# Patient Record
Sex: Male | Born: 1995 | Race: White | Hispanic: No | Marital: Single | Smoking: Current some day smoker
Health system: Southern US, Community
[De-identification: ages and names within clinical notes are randomized; demographics above are authoritative.]

---

## 2017-05-08 ENCOUNTER — Emergency Department
Admission: EM | Admit: 2017-05-08 | Discharge: 2017-05-08 | Disposition: A | Discharge status: Home / Self Care | Payer: 59 | Attending: Emergency Medicine | Admitting: Emergency Medicine

## 2017-05-08 ENCOUNTER — Emergency Department: Payer: 59

## 2017-05-08 ENCOUNTER — Encounter: Payer: Self-pay | Admitting: Emergency Medicine

## 2017-05-08 ENCOUNTER — Other Ambulatory Visit: Payer: Self-pay

## 2017-05-08 DIAGNOSIS — N201 Calculus of ureter: Secondary | ICD-10-CM

## 2017-05-08 DIAGNOSIS — F17210 Nicotine dependence, cigarettes, uncomplicated: Secondary | ICD-10-CM | POA: Diagnosis not present

## 2017-05-08 DIAGNOSIS — R1031 Right lower quadrant pain: Secondary | ICD-10-CM | POA: Diagnosis present

## 2017-05-08 LAB — CBC
HCT: 44.5 % (ref 40.0–52.0)
HEMOGLOBIN: 15.3 g/dL (ref 13.0–18.0)
MCH: 32.2 pg (ref 26.0–34.0)
MCHC: 34.3 g/dL (ref 32.0–36.0)
MCV: 93.7 fL (ref 80.0–100.0)
PLATELETS: 232 10*3/uL (ref 150–440)
RBC: 4.75 MIL/uL (ref 4.40–5.90)
RDW: 13.1 % (ref 11.5–14.5)
WBC: 10.9 10*3/uL — ABNORMAL HIGH (ref 3.8–10.6)

## 2017-05-08 LAB — COMPREHENSIVE METABOLIC PANEL
ALK PHOS: 43 U/L (ref 38–126)
ALT: 11 U/L — ABNORMAL LOW (ref 17–63)
ANION GAP: 12 (ref 5–15)
AST: 25 U/L (ref 15–41)
Albumin: 4.7 g/dL (ref 3.5–5.0)
BUN: 15 mg/dL (ref 6–20)
CALCIUM: 9.2 mg/dL (ref 8.9–10.3)
CHLORIDE: 103 mmol/L (ref 101–111)
CO2: 24 mmol/L (ref 22–32)
CREATININE: 1.27 mg/dL — AB (ref 0.61–1.24)
Glucose, Bld: 123 mg/dL — ABNORMAL HIGH (ref 65–99)
Potassium: 3.4 mmol/L — ABNORMAL LOW (ref 3.5–5.1)
SODIUM: 139 mmol/L (ref 135–145)
Total Bilirubin: 0.8 mg/dL (ref 0.3–1.2)
Total Protein: 7.8 g/dL (ref 6.5–8.1)

## 2017-05-08 LAB — URINALYSIS, COMPLETE (UACMP) WITH MICROSCOPIC
Bacteria, UA: NONE SEEN
Bilirubin Urine: NEGATIVE
GLUCOSE, UA: NEGATIVE mg/dL
KETONES UR: 5 mg/dL — AB
LEUKOCYTES UA: NEGATIVE
Nitrite: NEGATIVE
PH: 6 (ref 5.0–8.0)
PROTEIN: 30 mg/dL — AB
Specific Gravity, Urine: 1.02 (ref 1.005–1.030)

## 2017-05-08 LAB — LIPASE, BLOOD: LIPASE: 17 U/L (ref 11–51)

## 2017-05-08 MED ORDER — ONDANSETRON HCL 4 MG/2ML IJ SOLN
4.0000 mg | Freq: Once | INTRAMUSCULAR | Status: AC
  Administered 2017-05-08: 4 mg via INTRAVENOUS
  Filled 2017-05-08: qty 2

## 2017-05-08 MED ORDER — IBUPROFEN 600 MG PO TABS: 600.0000 mg | ORAL_TABLET | Freq: Four times a day (QID) | ORAL | 0 refills | Status: DC | PRN

## 2017-05-08 MED ORDER — IOPAMIDOL (ISOVUE-300) INJECTION 61%
75.0000 mL | Freq: Once | INTRAVENOUS | Status: AC | PRN
  Administered 2017-05-08: 75 mL via INTRAVENOUS

## 2017-05-08 MED ORDER — MORPHINE SULFATE (PF) 4 MG/ML IV SOLN
4.0000 mg | Freq: Once | INTRAVENOUS | Status: AC
  Administered 2017-05-08: 4 mg via INTRAVENOUS
  Filled 2017-05-08: qty 1

## 2017-05-08 MED ORDER — HYDROCODONE-ACETAMINOPHEN 5-325 MG PO TABS: 1.0000 | ORAL_TABLET | Freq: Four times a day (QID) | ORAL | 0 refills | Status: AC | PRN

## 2017-05-08 MED ORDER — IOPAMIDOL (ISOVUE-300) INJECTION 61%
30.0000 mL | Freq: Once | INTRAVENOUS | Status: AC | PRN
  Administered 2017-05-08: 30 mL via ORAL

## 2017-05-08 MED ORDER — IBUPROFEN 600 MG PO TABS: 600.0000 mg | ORAL_TABLET | Freq: Four times a day (QID) | ORAL | 0 refills | Status: AC | PRN

## 2017-05-08 MED ORDER — HYDROCODONE-ACETAMINOPHEN 5-325 MG PO TABS: 1.0000 | ORAL_TABLET | Freq: Four times a day (QID) | ORAL | 0 refills | Status: DC | PRN

## 2017-05-08 MED ORDER — SODIUM CHLORIDE 0.9 % IV BOLUS (SEPSIS)
1000.0000 mL | Freq: Once | INTRAVENOUS | Status: AC
  Administered 2017-05-08: 1000 mL via INTRAVENOUS

## 2017-05-08 NOTE — ED Notes (Signed)
Patient ambulatory to lobby with steady gait and NAD noted. Verbalized understanding of discharge instructions and follow-up care.  

## 2017-05-08 NOTE — Discharge Instructions (Signed)
Return to the ER for new, worsening, or persistent severe pain, vomiting, fevers, weakness, or any other new or worsening symptoms that concern you.  You can take the ibuprofen for pain, and the Vicodin for hydrocodone for more severe pain not controlled by the ibuprofen.  If your symptoms resolve you can follow-[facility] at the [facility], but a urology referral has been provided for you as well.

## 2017-05-08 NOTE — ED Triage Notes (Signed)
C/O RLQ abdominal pain, onset this morning at 0530.  Symptoms include N/V.  Able to tolerate water PO.

## 2017-05-08 NOTE — ED Provider Notes (Signed)
[facility] Emergency Department Provider Note ____________________________________________   First MD Initiated Contact with Patient 05/08/17 1222     (approximate)  I have reviewed the triage vital signs and the nursing notes.   HISTORY  Chief Complaint Abdominal Pain    HPI Noah Gonzales is a 22 y.o. male with no significant past medical history who presents with right lower quadrant pain, acute onset approximately 6 hours ago, persistent course, nonradiating, and associated with nausea and one episode of vomiting.  Patient denies diarrhea or associated fever or chills.  Last bowel movement was yesterday and was normal.  No prior history of this pain.   History reviewed. No pertinent past medical history.  There are no active problems to display for this patient.   History reviewed. No pertinent surgical history.  Prior to Admission medications   Medication Sig Start Date End Date Taking? Authorizing Provider  HYDROcodone-acetaminophen (NORCO/VICODIN) 5-325 MG tablet Take 1 tablet by mouth every 6 (six) hours as needed for up to 5 days for severe pain. 05/08/17 05/13/17  Dionne Bucy, MD  ibuprofen (ADVIL,MOTRIN) 600 MG tablet Take 1 tablet (600 mg total) by mouth every 6 (six) hours as needed. 05/08/17   Dionne Bucy, MD    Allergies Patient has no known allergies.  No family history on file.  Social History Social History   Tobacco Use  . Smoking status: Current Some Day Smoker    Types: Cigarettes  . Smokeless tobacco: Never Used  Substance Use Topics  . Alcohol use: Not on file  . Drug use: Not on file    Review of Systems  Constitutional: No fever. Eyes: No redness. ENT: No sore throat. Cardiovascular: Denies chest pain. Respiratory: Denies shortness of breath. Gastrointestinal: Positive for nausea and one episode of vomiting.  Genitourinary: Negative for dysuria.  Musculoskeletal: Negative for back  pain. Skin: Negative for rash. Neurological: Negative for headache.   ____________________________________________   PHYSICAL EXAM:  VITAL SIGNS: ED Triage Vitals  Enc Vitals Group     BP 05/08/17 1009 116/82     Pulse Rate 05/08/17 1009 (!) 57     Resp 05/08/17 1009 18     Temp 05/08/17 1009 97.7 F (36.5 C)     Temp Source 05/08/17 1009 Oral     SpO2 05/08/17 1009 100 %     Weight 05/08/17 1009 140 lb (63.5 kg)     Height 05/08/17 1009 5\' 8"  (1.727 m)     Head Circumference --      Peak Flow --      Pain Score 05/08/17 1023 6     Pain Loc --      Pain Edu? --      Excl. in GC? --     Constitutional: Alert and oriented. Well appearing and in no acute distress. Eyes: Conjunctivae are normal.  No scleral icterus. Head: Atraumatic. Nose: No congestion/rhinnorhea. Mouth/Throat: Mucous membranes are moist.   Neck: Normal range of motion.  Cardiovascular: Good peripheral circulation. Respiratory: Normal respiratory effort.   Gastrointestinal: Soft with moderate right lower quadrant tenderness. No distention.  Genitourinary: No flank tenderness. Musculoskeletal: Extremities warm and well perfused.  Neurologic:  Normal speech and language. No gross focal neurologic deficits are appreciated.  Skin:  Skin is warm and dry. No rash noted. Psychiatric: Mood and affect are normal. Speech and behavior are normal.  ____________________________________________   LABS (all labs ordered are listed, but only abnormal results are displayed)  Labs Reviewed  COMPREHENSIVE METABOLIC PANEL - Abnormal; Notable for the following components:      Result Value   Potassium 3.4 (*)    Glucose, Bld 123 (*)    Creatinine, Ser 1.27 (*)    ALT 11 (*)    All other components within normal limits  CBC - Abnormal; Notable for the following components:   WBC 10.9 (*)    All other components within normal limits  URINALYSIS, COMPLETE (UACMP) WITH MICROSCOPIC - Abnormal; Notable for the  following components:   Color, Urine AMBER (*)    APPearance HAZY (*)    Hgb urine dipstick LARGE (*)    Ketones, ur 5 (*)    Protein, ur 30 (*)    Squamous Epithelial / LPF 0-5 (*)    All other components within normal limits  LIPASE, BLOOD   ____________________________________________  EKG   ____________________________________________  RADIOLOGY  CT abd: 2 mm stone right UVJ, no evidence of acute appendicitis  ____________________________________________   PROCEDURES  Procedure(s) performed: No  Procedures  Critical Care performed: No ____________________________________________   INITIAL IMPRESSION / ASSESSMENT AND PLAN / ED COURSE  Pertinent labs & imaging results that were available during my care of the patient were reviewed by me and considered in my medical decision making (see chart for details).  22 year old male with no significant past medical history presents with relatively acute onset of right lower quadrant pain associated with nausea.  Past medical records reviewed in epic and are noncontributory.  On exam, the patient is well-appearing, vital signs are normal, but there is relatively significant tenderness in the right lower quadrant.  Presentation is most concerning for acute appendicitis; differential also includes mesenteric adenitis or other benign cause.  Plan for labs, UA, and CT abdomen.  ----------------------------------------- 2:16 PM on 05/08/2017 -----------------------------------------  CT shows 2 mm right UVJ stone with no evidence of acute appendicitis.  Patient's pain is well controlled, UA is not consistent with infection, and the lab workup is unremarkable.  The patient feels well to go home.  No indication for emergent urological consultation.  We will discharge home with analgesia and urology referral.  Workup and plan explained to the patient, and return precautions given; he expresses  understanding. ____________________________________________   FINAL CLINICAL IMPRESSION(S) / ED DIAGNOSES  Final diagnoses:  Ureteral stone      NEW MEDICATIONS STARTED DURING THIS VISIT:  New Prescriptions   HYDROCODONE-ACETAMINOPHEN (NORCO/VICODIN) 5-325 MG TABLET    Take 1 tablet by mouth every 6 (six) hours as needed for up to 5 days for severe pain.   IBUPROFEN (ADVIL,MOTRIN) 600 MG TABLET    Take 1 tablet (600 mg total) by mouth every 6 (six) hours as needed.     Note:  This document was prepared using Dragon voice recognition software and may include unintentional dictation errors.    Dionne BucySiadecki, Noah Konczal, MD 05/08/17 1419

## 2017-05-08 NOTE — ED Notes (Signed)
Updated friend of pt that he was next to get back to see the Dr.

## 2019-01-11 IMAGING — CT CT ABD-PELV W/ CM
2 of 4 series · 15 of 46 positions shown, 17 images · IV contrast (APPLIED)
Comparison: None.

CLINICAL DATA: Right lower quadrant pain.  Nausea and vomiting.

EXAM:
CT ABDOMEN AND PELVIS WITH CONTRAST
TECHNIQUE: Multidetector CT imaging of the abdomen and pelvis was performed
using the standard protocol following bolus administration of
intravenous contrast.
CONTRAST:  75mL [id number] IOPAMIDOL ([id number]) INJECTION 61%

[Series 2: routine abd/pel with · axial · 0.64mm/px · z∈[-491,-51]mm · 12 of 100 slices shown, 14 images]
[im 8/100  soft-tissue]
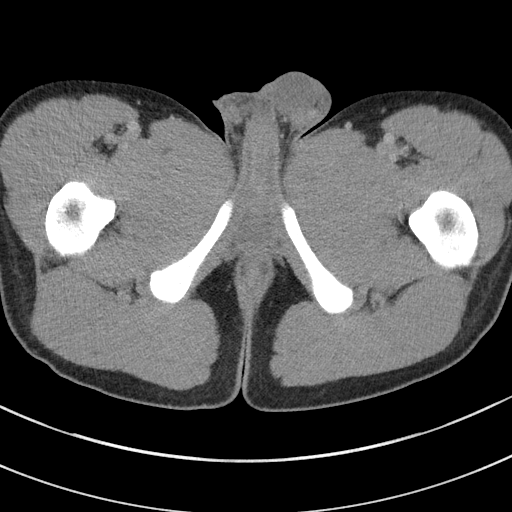
[im 8/100  bone]
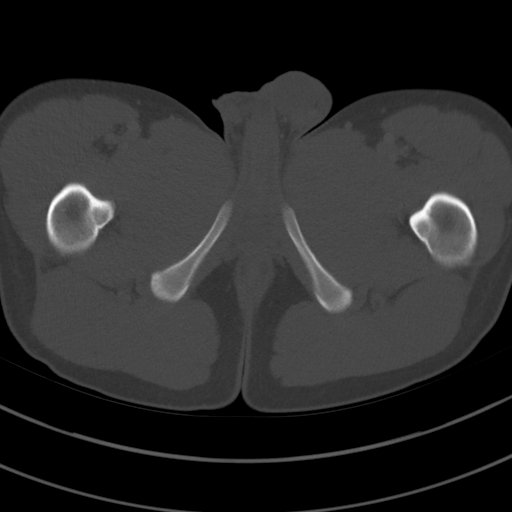
[im 16/100  soft-tissue]
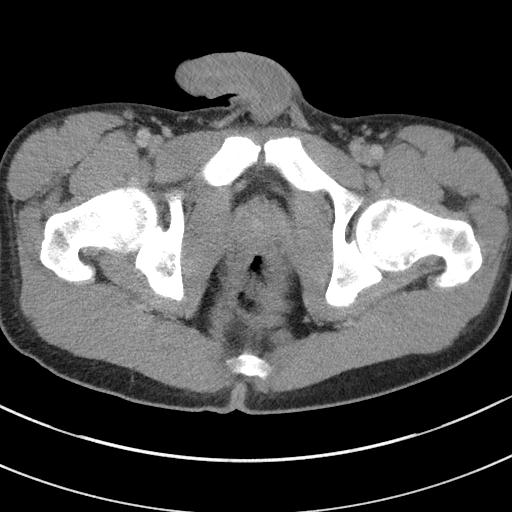
[im 24/100  soft-tissue]
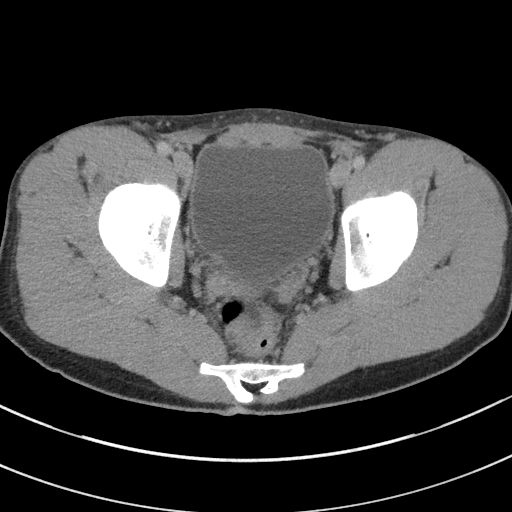
[im 32/100  soft-tissue]
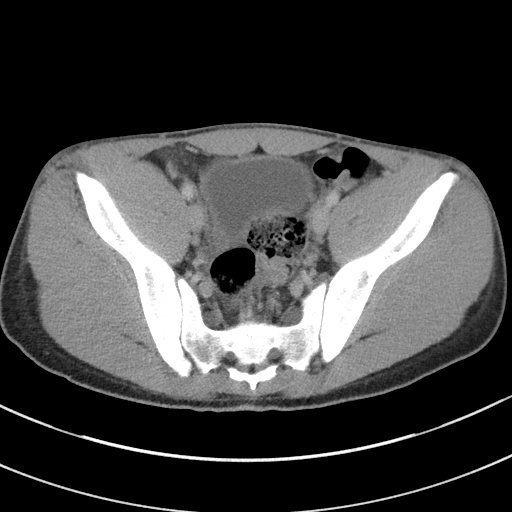
[im 40/100  soft-tissue]
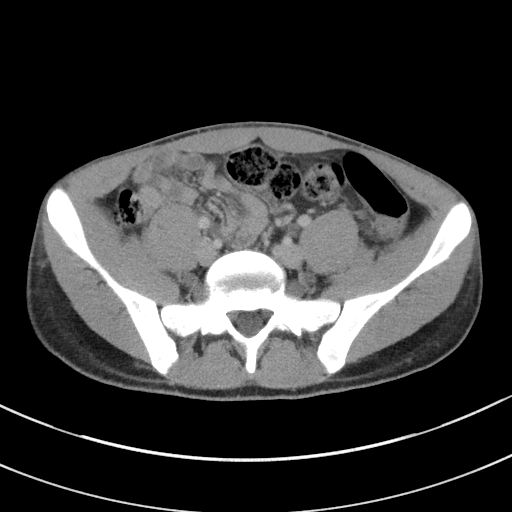
[im 48/100  soft-tissue]
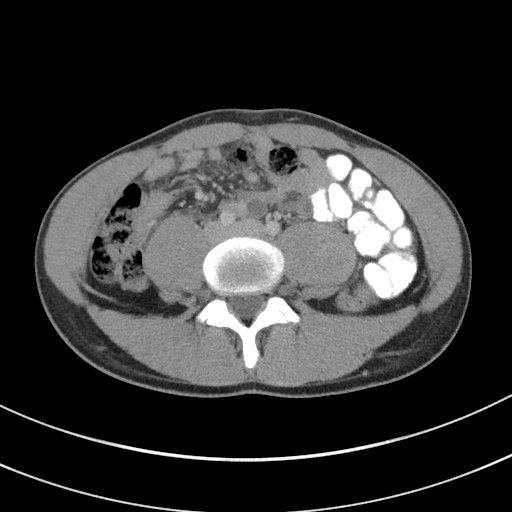
[im 56/100  soft-tissue]
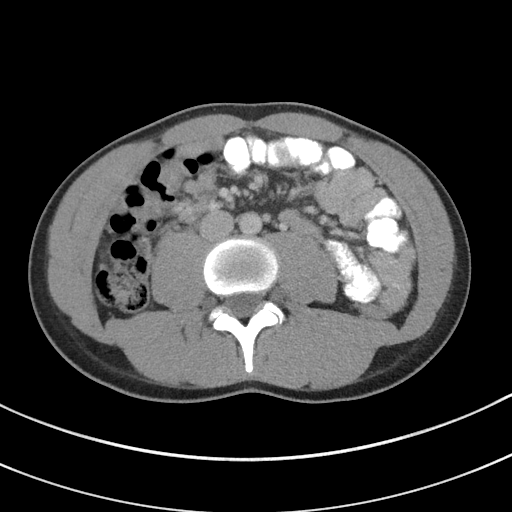
[im 64/100  soft-tissue]
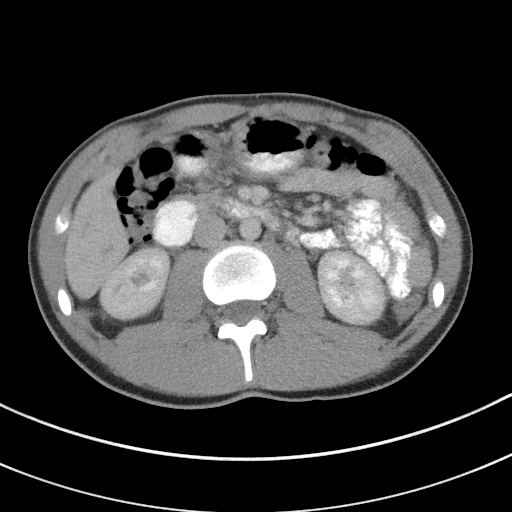
[im 72/100  soft-tissue]
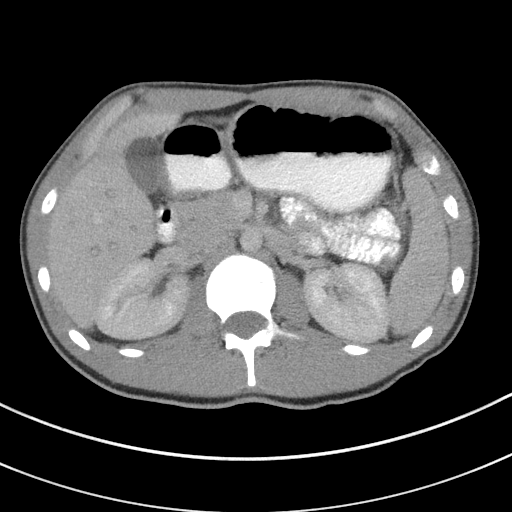
[im 72/100  bone]
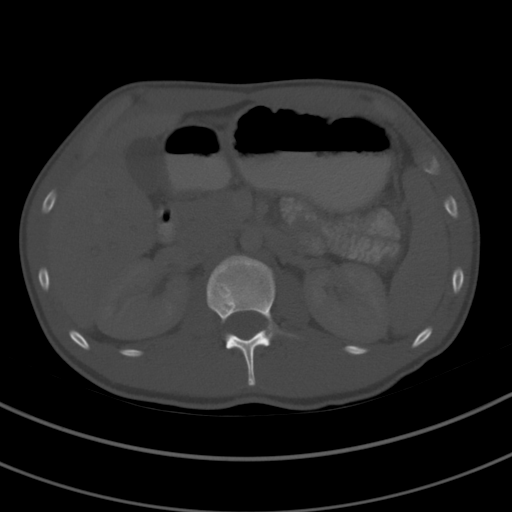
[im 80/100  soft-tissue]
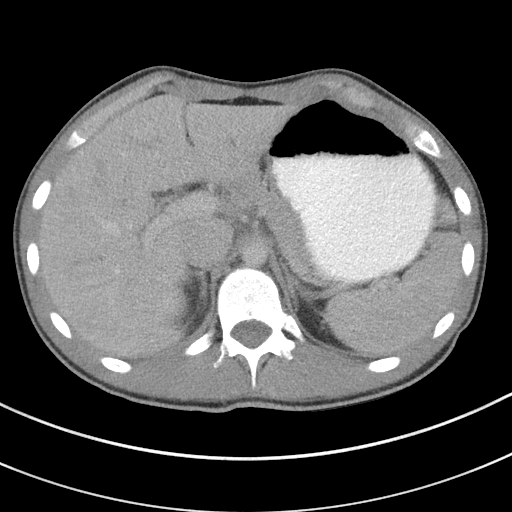
[im 88/100  soft-tissue]
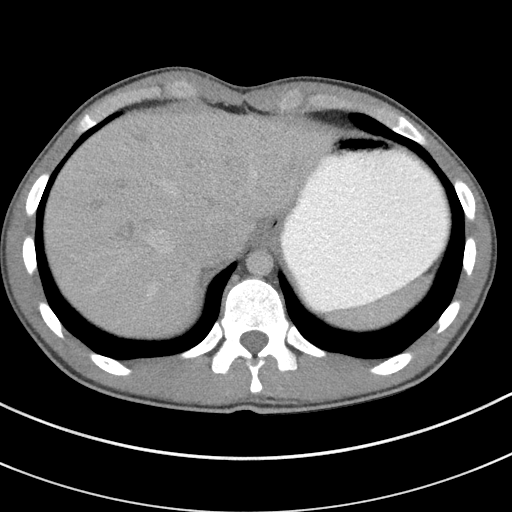
[im 96/100  soft-tissue]
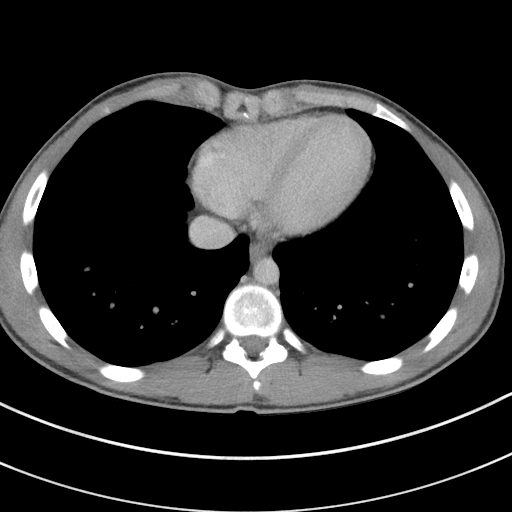

[Series 5: coronal st · coronal · 0.70mm/px · 3 of 69 slices shown]
[im 23/69  soft-tissue]
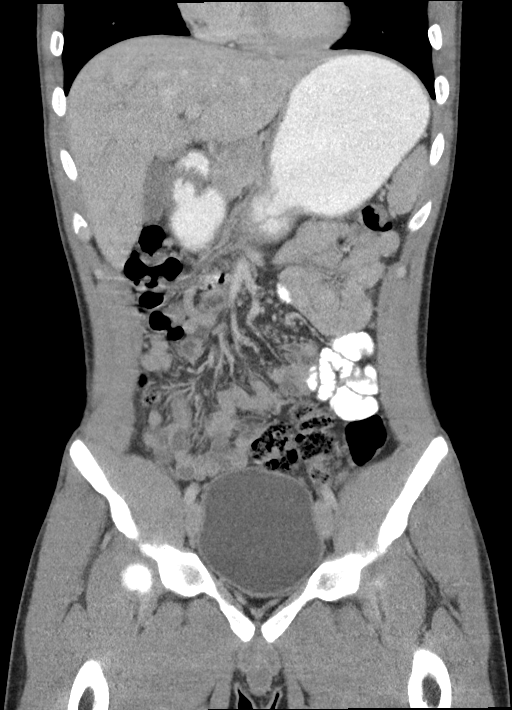
[im 31/69  soft-tissue]
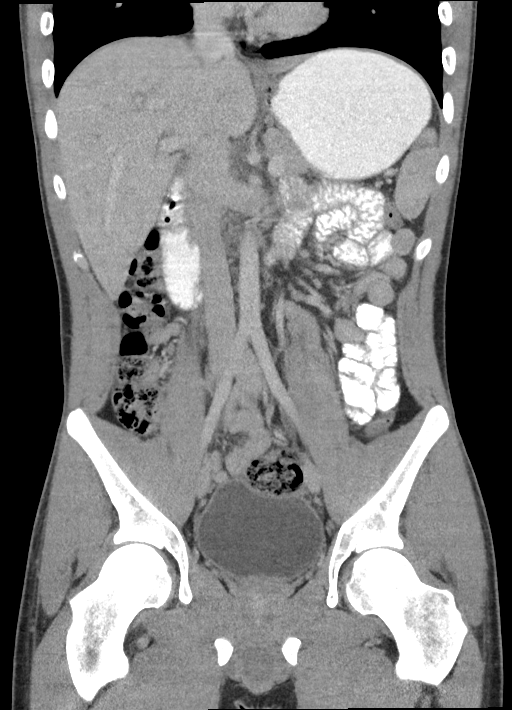
[im 38/69  soft-tissue]
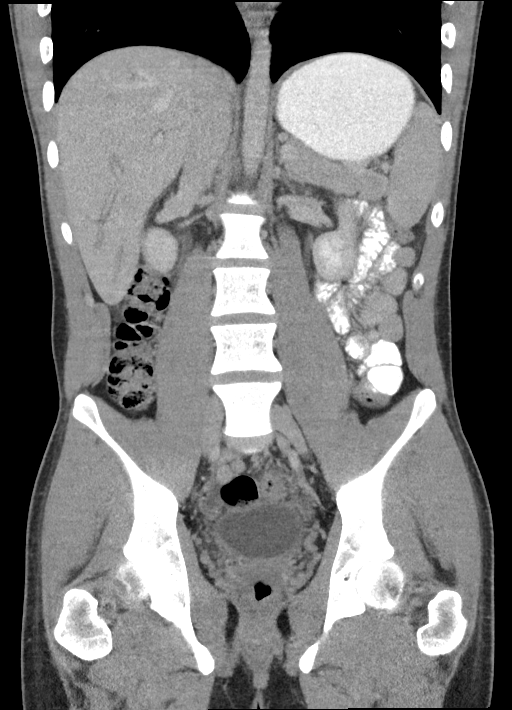

[15 of 46 positions shown; findings below may reference images not displayed]

FINDINGS: Lower chest: Unremarkable

Hepatobiliary: Mild periportal edema evident. No focal abnormality
within the liver parenchyma. There is no evidence for gallstones,
gallbladder wall thickening, or pericholecystic fluid. No
intrahepatic or extrahepatic biliary dilation.

Pancreas: No focal mass lesion. No dilatation of the main duct. No
intraparenchymal cyst. No peripancreatic edema.

Spleen: No splenomegaly. No focal mass lesion.

Adrenals/Urinary Tract: No adrenal nodule or mass. 7 mm
hypoattenuating lesion interpolar left kidney too small to
characterize but likely a cyst. Right kidney demonstrates mild
fullness of the intrarenal collecting system. Mild fullness noted
right ureter with associated periureteric edema. 2 mm stone
identified in the right UVJ (image 79 series 2).

Stomach/Bowel: Stomach is distended with oral contrast material.
Duodenum is normally positioned as is the ligament of Treitz. No
small bowel wall thickening. No small bowel dilatation. The terminal
ileum is normal. The appendix is not visualized, but there is no
edema or inflammation in the region of the cecum. No gross colonic
mass. No colonic wall thickening. No substantial diverticular
change.

Vascular/Lymphatic: No abdominal aortic aneurysm. There is no
gastrohepatic or hepatoduodenal ligament lymphadenopathy. No
intraperitoneal or retroperitoneal lymphadenopathy. No pelvic
sidewall lymphadenopathy.

Reproductive: The prostate gland and seminal vesicles have normal
imaging features.

Other: No intraperitoneal free fluid.

Musculoskeletal: Bone windows reveal no worrisome lytic or sclerotic
osseous lesions.
IMPRESSION: 1. 2 mm right UVJ stone with mild secondary changes in the right
kidney and ureter.
2. Mild periportal edema, presumably related to the right-sided
urinary stone disease.

## 2019-04-28 ENCOUNTER — Emergency Department: Admit: 2019-04-28 | Payer: PRIVATE HEALTH INSURANCE | Primary: Pediatrics

## 2019-04-28 ENCOUNTER — Inpatient Hospital Stay: Admit: 2019-04-28 | Discharge: 2019-04-28 | Payer: BLUE CROSS/BLUE SHIELD

## 2019-04-28 DIAGNOSIS — S8992XA Unspecified injury of left lower leg, initial encounter: Secondary | ICD-10-CM

## 2019-04-28 DIAGNOSIS — R202 Paresthesia of skin: Secondary | ICD-10-CM

## 2019-04-28 DIAGNOSIS — M25562 Pain in left knee: Secondary | ICD-10-CM

## 2019-04-28 DIAGNOSIS — R200 Anesthesia of skin: Secondary | ICD-10-CM

## 2019-04-28 MED ORDER — IBUPROFEN 600 MG TABLET
600 mg | Freq: Once | ORAL | Status: CP
Start: 2019-04-28 — End: ?
  Administered 2019-04-28: 18:00:00 600 mg via ORAL

## 2019-04-28 NOTE — ED Notes
3:03 PM Pt cleared for DC

## 2019-04-28 NOTE — ED Provider Notes
HistoryChief Complaint Patient presents with ? Knee Pain   Pt states went skiing 2 weeks ago, since then has had soreness on L knee. 2 days later pt accidenlty hit L knee while at work. Continues to have pain. [phone]  24yo M p/w L knee pain x 2 weeksHas not seen a doctor b/c has been sorting through insuranceWas skiing 2 weeks ago when first started - gradual onset. Hurts on and off, sometimes worse than others, and just not getting better at this point. Has had similar L knee pain brought on with skiing, sports, other activities starting 6-7 years ago. Has seen orthopedist for it in the past, has been told sprained MCL and did something to knee cap. Had MRI last year of L knee but does not remember what it showed. Has been to PT a few times for this.Came to ED today b/c to go to orthopedic surgeon would have had to go to PCP first, but since he lives in [place] that's not possible right nowCurrently pain is 3-4/10Has not taken motrin today. Sometimes able to walk, sometimes feels uncomfortable, starts clicking, walks with a limp. 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 HENT: Negative for congestion and rhinorrhea.  Respiratory: Negative for cough.  Gastrointestinal: Negative for diarrhea and vomiting. Genitourinary: Negative for decreased urine volume. Musculoskeletal: Positive for gait problem. Negative for back pain.      Knee pain Skin: Negative for wound. Neurological: Negative for headaches. Psychiatric/Behavioral: Negative for agitation.  Physical ExamED Triage Vitals [04/28/19 1313]BP: 128/82Pulse: 72Pulse from  O2 sat: n/aResp: 18Temp: 97.8 ?F (36.6 ?C)Temp src: TemporalSpO2: 100 % BP 128/82  - Pulse 72  - Temp 97.8 ?F (36.6 ?C) (Temporal)  - Resp 18  - Wt 63.2 kg  - SpO2 100% Physical ExamConstitutional:     General: He is not in acute distress.   Appearance: He is well-developed. HENT:    Head: Normocephalic and atraumatic.    Mouth/Throat:    Pharynx: No oropharyngeal exudate. Eyes:    Conjunctiva/sclera: Conjunctivae normal.    Pupils: Pupils are equal, round, and reactive to light. Neck:    Musculoskeletal: Normal range of motion and neck supple. Cardiovascular:    Rate and Rhythm: Normal rate. Pulmonary:    Effort: Pulmonary effort is normal. No respiratory distress. Abdominal:    General: Abdomen is flat. There is no distension.    Palpations: Abdomen is soft. Musculoskeletal: Normal range of motion.       General: Tenderness (L lateral joint line, patella, patellar tendon > L medial joint line, posterior knee) present. No swelling.    Right lower leg: No edema.    Left lower leg: No edema.    Comments: Normal straight leg raiseSlight limp with ambulationNormal strength LLEEndorses feeling weird LLE below knee, although states might be because I was outside and my feet are coldNormal and symmetric capillary refill b/l LEs Skin:   General: Skin is warm and dry.    Capillary Refill: Capillary refill takes less than 2 seconds. Neurological:    Mental Status: He is alert and oriented to person, place, and time. 24yo M p/w L knee pain x 2 weeks, has had prior issues with same knee, seen ortho, gone to PT. Vitals wnl, patient well appearing, with fairly  diffuse TTP L knee at joint line, patella, patellar tendon but ambulatory and with intact strength and blood flow, with some paresthesias/change in sensation of LLE below knee. Likely meniscal vs LCL or other collateral ligament injury, vs bone contusion or fracture. No evidence of systemic illness, other joint involvement, or fever to suggest septic joint or JIA. Will obtain XR and provide motrin for comfort. Discussed with patient that this will require outpatient MRI and f/u with orthopedics. Provided f/u info for [facility]. Also encouraged Lawrence Green to establish PCP care and orthopedist care in [place] where he lives. ProceduresProcedures ED COURSEReviewed previous: previous chartInterpreted by ED Provider: x-rayPatient Reevaluation: XR without evidence of fracture or knee effusionPatient provided brace and crutches, advised to f/u outpatient with orthopedics, likely requires MRIAG provided regarding home care and RTC precautions, including close f/u with PCP and return to ED if symptoms worsen. Patient understands and in agreement with plan.Clinical Impressions as of Apr 28 1435 Left knee injury, initial encounter  ED DispositionDischarge [name], MD02/10/21 1438

## 2019-04-28 NOTE — ED Notes
2:09 PM Received report from [employer], assumed care

## 2019-04-29 NOTE — Telephone Encounter
i'm a little sore but doing ok and will f/u with orthopedics, getting around slowly
